# Patient Record
Sex: Male | Born: 1990 | Race: Black or African American | Hispanic: No | Marital: Married | Smoking: Never smoker
Health system: Southern US, Community
[De-identification: ages and names within clinical notes are randomized; demographics above are authoritative.]

---
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
## 2015-08-02 ENCOUNTER — Encounter (HOSPITAL_COMMUNITY): Payer: Self-pay

## 2015-08-02 ENCOUNTER — Emergency Department (HOSPITAL_COMMUNITY)
Admission: EM | Admit: 2015-08-02 | Discharge: 2015-08-02 | Disposition: A | Discharge status: Home / Self Care | Payer: Self-pay | Attending: Emergency Medicine | Admitting: Emergency Medicine

## 2015-08-02 ENCOUNTER — Emergency Department (HOSPITAL_COMMUNITY): Payer: Self-pay

## 2015-08-02 DIAGNOSIS — S99911A Unspecified injury of right ankle, initial encounter: Secondary | ICD-10-CM | POA: Insufficient documentation

## 2015-08-02 DIAGNOSIS — Y999 Unspecified external cause status: Secondary | ICD-10-CM | POA: Insufficient documentation

## 2015-08-02 DIAGNOSIS — Y9339 Activity, other involving climbing, rappelling and jumping off: Secondary | ICD-10-CM | POA: Insufficient documentation

## 2015-08-02 DIAGNOSIS — Y929 Unspecified place or not applicable: Secondary | ICD-10-CM | POA: Insufficient documentation

## 2015-08-02 DIAGNOSIS — X509XXA Other and unspecified overexertion or strenuous movements or postures, initial encounter: Secondary | ICD-10-CM | POA: Insufficient documentation

## 2015-08-02 MED ORDER — NAPROXEN 500 MG PO TABS: 500.0000 mg | ORAL_TABLET | Freq: Two times a day (BID) | ORAL | Status: AC

## 2015-08-02 NOTE — Progress Notes (Signed)
Orthopedic Tech Progress Note Patient Details:  Benjamin Bridges February 21, 1991 [id number]  Ortho Devices Type of Ortho Device: ASO, Crutches Ortho Device/Splint Location: rle Ortho Device/Splint Interventions: Application   Moxie Kalil 08/02/2015, 11:15 AM

## 2015-08-02 NOTE — ED Provider Notes (Signed)
CSN: [id number]     Arrival date & time 08/02/15  0932 History  By signing my name below, I, Renetta Chalk, attest that this documentation has been prepared under the direction and in the presence of Rian Busche PA-C.  Electronically Signed: Renetta Chalk, ED Scribe. 07/30/2015. 4:01 PM.  Chief Complaint  Patient presents with  . Ankle Injury   The history is provided by the patient. No language interpreter was used.   HPI Comments: Benjamin Bridges is a 25 y.o. male who presents to the Emergency Department complaining of constant, gradually worsening, right ankle pain that started last night. Pt states he was playing basketball when he jumped and twisted his ankle, causing it to invert. Pt rates his pain as 7/10. Pt reports pain is aggravated while bearing weight. He has applied ice with no relief. Pt denies taking any OTC medication for pain. He denies numbness or weakness, head trauma, neck/back pain, or any other complaints or injuries.  History reviewed. No pertinent past medical history. History reviewed. No pertinent past surgical history. No family history on file. Social History  Substance Use Topics  . Smoking status: Never Smoker   . Smokeless tobacco: None  . Alcohol Use: None    Review of Systems  Musculoskeletal: Positive for arthralgias (Right ankle).  Neurological: Negative for weakness and numbness.    Allergies  Review of patient's allergies indicates no known allergies.  Home Medications   Prior to Admission medications   Medication Sig Start Date End Date Taking? Authorizing Provider  naproxen (NAPROSYN) 500 MG tablet Take 1 tablet (500 mg total) by mouth 2 (two) times daily. 08/02/15   Lemya Greenwell C Ilamae Geng, PA-C   BP 124/62 mmHg  Pulse 53  Temp(Src) 98.6 F (37 C) (Oral)  Resp 18  SpO2 99% Physical Exam  Constitutional: He appears well-developed and well-nourished. No distress.  HENT:  Head: Normocephalic and atraumatic.  Eyes: Conjunctivae are normal.  Neck: Normal  range of motion. Neck supple.  Cardiovascular: Normal rate, regular rhythm and intact distal pulses.   Pulmonary/Chest: Effort normal.  Musculoskeletal: Normal range of motion. He exhibits tenderness.  Minor swelling and tenderness to medial right foot and ankle, distal pulses intact, full range of motion.  Neurological: He is alert.  No sensory deficits, strength 5/5.  Skin: Skin is warm and dry. He is not diaphoretic.  Psychiatric: He has a normal mood and affect. Judgment normal.  Nursing note and vitals reviewed.   ED Course  Procedures  DIAGNOSTIC STUDIES: Oxygen Saturation is 99% on RA, normal by my interpretation.  COORDINATION OF CARE: 10:10 AM Will order imaging of right ankle and advised to rest, ice, and splint the affected area. Discussed treatment plan with pt at bedside and pt agreed to plan.   Imaging Review Dg Ankle Complete Right  08/02/2015  CLINICAL DATA:  Pain following twisting injury while playing basketball 1 day prior EXAM: RIGHT ANKLE - COMPLETE 3+ VIEW COMPARISON:  None. FINDINGS: Frontal, oblique, and lateral views obtained. There is no fracture or joint effusion. The ankle mortise appears intact. There is a small spur along the proximal dorsal navicular. No appreciable joint space narrowing. IMPRESSION: Small spur along the dorsal proximal navicular. No demonstrable fracture. Ankle mortise appears intact. Electronically Signed   By: Bretta Bang III M.D.   On: 08/02/2015 10:09   I have personally reviewed and evaluated these images as part of my medical decision-making.   EKG Interpretation None      MDM  Final diagnoses:  Ankle injury, right, initial encounter   Benjamin Bridges presents with right ankle injury occurring last night.  No fracture. Suspect sprain. RICE protocol and orthopedic follow-up discussed. Patient voiced understanding of these instructions and is comfortable with discharge.  Filed Vitals:   08/02/15 0937 08/02/15 1045   BP: 124/62 126/60  Pulse: 53 56  Temp: 98.6 F (37 C) 98.8 F (37.1 C)  TempSrc: Oral Oral  Resp: 18 20  SpO2: 99% 100%     I personally performed the services described in this documentation, which was scribed in my presence. The recorded information has been reviewed and is accurate.   Anselm PancoastShawn C Asher Babilonia, PA-C 08/02/15 1737  Pricilla LovelessScott Goldston, MD 08/03/15 [phone]

## 2015-08-02 NOTE — Discharge Instructions (Signed)
You have been seen today for an ankle injury. Your imaging showed no abnormalities. Follow-up with orthopedics should symptoms continue. Rest, ice, compression, and elevation, as well as anti-inflammatory medications such as naproxen or ibuprofen, are the indicated treatments for this issue. Take 500 mg of naproxen every 12 hours or 800 mg of ibuprofen every 8 hours for the next 3 days. Take these medications with food to avoid an upset stomach. Follow up with PCP as needed. Return to ED should symptoms worsen.

## 2015-08-02 NOTE — ED Notes (Signed)
Ortho called to come place ASO and give crutches.

## 2015-08-02 NOTE — ED Notes (Signed)
Patient here with right ankle pain after twisting same while playing basketball last night

## 2023-05-17 ENCOUNTER — Emergency Department (HOSPITAL_COMMUNITY)
Admission: EM | Admit: 2023-05-17 | Discharge: 2023-05-17 | Disposition: A | Discharge status: Home / Self Care | Payer: Self-pay | Attending: Emergency Medicine | Admitting: Emergency Medicine

## 2023-05-17 ENCOUNTER — Emergency Department (HOSPITAL_COMMUNITY): Payer: Self-pay

## 2023-05-17 DIAGNOSIS — R630 Anorexia: Secondary | ICD-10-CM | POA: Insufficient documentation

## 2023-05-17 DIAGNOSIS — R1114 Bilious vomiting: Secondary | ICD-10-CM | POA: Insufficient documentation

## 2023-05-17 LAB — CBC WITH DIFFERENTIAL/PLATELET
Abs Immature Granulocytes: 0.05 10*3/uL (ref 0.00–0.07)
Basophils Absolute: 0.1 10*3/uL (ref 0.0–0.1)
Basophils Relative: 0 %
Eosinophils Absolute: 0 10*3/uL (ref 0.0–0.5)
Eosinophils Relative: 0 %
HCT: 49.7 % (ref 39.0–52.0)
Hemoglobin: 16.9 g/dL (ref 13.0–17.0)
Immature Granulocytes: 0 %
Lymphocytes Relative: 7 %
Lymphs Abs: 0.9 10*3/uL (ref 0.7–4.0)
MCH: 30.4 pg (ref 26.0–34.0)
MCHC: 34 g/dL (ref 30.0–36.0)
MCV: 89.4 fL (ref 80.0–100.0)
Monocytes Absolute: 0.6 10*3/uL (ref 0.1–1.0)
Monocytes Relative: 4 %
Neutro Abs: 11.9 10*3/uL — ABNORMAL HIGH (ref 1.7–7.7)
Neutrophils Relative %: 89 %
Platelets: 282 10*3/uL (ref 150–400)
RBC: 5.56 MIL/uL (ref 4.22–5.81)
RDW: 13.4 % (ref 11.5–15.5)
WBC: 13.5 10*3/uL — ABNORMAL HIGH (ref 4.0–10.5)
nRBC: 0 % (ref 0.0–0.2)

## 2023-05-17 LAB — COMPREHENSIVE METABOLIC PANEL
ALT: 36 U/L (ref 0–44)
AST: 22 U/L (ref 15–41)
Albumin: 3.9 g/dL (ref 3.5–5.0)
Alkaline Phosphatase: 38 U/L (ref 38–126)
Anion gap: 6 (ref 5–15)
BUN: 11 mg/dL (ref 6–20)
CO2: 25 mmol/L (ref 22–32)
Calcium: 8.2 mg/dL — ABNORMAL LOW (ref 8.9–10.3)
Chloride: 109 mmol/L (ref 98–111)
Creatinine, Ser: 0.99 mg/dL (ref 0.61–1.24)
GFR, Estimated: >60 mL/min (ref >60)
Glucose, Bld: 92 mg/dL (ref 70–99)
Potassium: 3.3 mmol/L — ABNORMAL LOW (ref 3.5–5.1)
Sodium: 140 mmol/L (ref 135–145)
Total Bilirubin: 0.7 mg/dL (ref 0.0–1.2)
Total Protein: 6.5 g/dL (ref 6.5–8.1)

## 2023-05-17 LAB — MAGNESIUM: Magnesium: 1.6 mg/dL — ABNORMAL LOW (ref 1.7–2.4)

## 2023-05-17 LAB — ETHANOL: Alcohol, Ethyl (B): <10 mg/dL (ref <10)

## 2023-05-17 LAB — RESP PANEL BY RT-PCR (RSV, FLU A&B, COVID)  RVPGX2
Influenza A by PCR: NEGATIVE
Influenza B by PCR: NEGATIVE
Resp Syncytial Virus by PCR: NEGATIVE
SARS Coronavirus 2 by RT PCR: NEGATIVE

## 2023-05-17 MED ORDER — FAMOTIDINE 20 MG PO TABS: 20.0000 mg | ORAL_TABLET | Freq: Two times a day (BID) | ORAL | 0 refills | Status: AC

## 2023-05-17 MED ORDER — SUCRALFATE 1 GM/10ML PO SUSP
1.0000 g | Freq: Once | ORAL | Status: AC
  Administered 2023-05-17: 1 g via ORAL
  Filled 2023-05-17: qty 10

## 2023-05-17 MED ORDER — SUCRALFATE 1 G PO TABS: 1.0000 g | ORAL_TABLET | Freq: Three times a day (TID) | ORAL | 0 refills | Status: AC

## 2023-05-17 MED ORDER — SODIUM CHLORIDE 0.9 % IV BOLUS
1000.0000 mL | Freq: Once | INTRAVENOUS | Status: AC
  Administered 2023-05-17: 1000 mL via INTRAVENOUS

## 2023-05-17 MED ORDER — IOHEXOL 300 MG/ML  SOLN
100.0000 mL | Freq: Once | INTRAMUSCULAR | Status: AC | PRN
  Administered 2023-05-17: 100 mL via INTRAVENOUS

## 2023-05-17 MED ORDER — DROPERIDOL 2.5 MG/ML IJ SOLN: 1.2500 mg | Freq: Once | INTRAMUSCULAR | Status: DC

## 2023-05-17 MED ORDER — FAMOTIDINE IN NACL 20-0.9 MG/50ML-% IV SOLN
20.0000 mg | Freq: Once | INTRAVENOUS | Status: AC
  Administered 2023-05-17: 20 mg via INTRAVENOUS
  Filled 2023-05-17: qty 50

## 2023-05-17 MED ORDER — ONDANSETRON HCL 4 MG/2ML IJ SOLN
4.0000 mg | Freq: Once | INTRAMUSCULAR | Status: AC
  Administered 2023-05-17: 4 mg via INTRAVENOUS
  Filled 2023-05-17: qty 2
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
## 2023-05-17 NOTE — ED Provider Notes (Signed)
 [place] EMERGENCY DEPARTMENT AT [facility] Provider Note   CSN: [id number] Arrival date & time: 05/17/23  1212     History  Chief Complaint  Patient presents with   Emesis    [name] is a 33 y.o. male.  HPI Presents 1 day after his birthday now with abdominal pain, nausea, vomiting. He notes that he was having some pain in his abdomen before celebrating, drinking.  He has continued have pain, worse since that time with associated nausea, vomiting, anorexia. No other pain, no relief with anything, no ability to tolerate p.o. for anything.  Patient denies history of abdominal surgery.    Home Medications Prior to Admission medications   Medication Sig Start Date End Date Taking? Authorizing Provider  famotidine (PEPCID) 20 MG tablet Take 1 tablet (20 mg total) by mouth 2 (two) times daily. 05/17/23  Yes Gerhard Munch, MD  sucralfate (CARAFATE) 1 g tablet Take 1 tablet (1 g total) by mouth 4 (four) times daily -  with meals and at bedtime. 05/17/23  Yes Gerhard Munch, MD  naproxen (NAPROSYN) 500 MG tablet Take 1 tablet (500 mg total) by mouth 2 (two) times daily. 08/02/15   Joy, Shawn C, PA-C      Allergies    Patient has no known allergies.    Review of Systems   Review of Systems  Physical Exam Updated Vital Signs BP 120/63 (BP Location: Right Arm)   Pulse 97   Temp 99 F (37.2 C) (Oral)   Resp 14   Ht 5\' 7"  (1.702 m)   Wt 99.8 kg   SpO2 100%   BMI 34.46 kg/m  Physical Exam Vitals and nursing note reviewed.  Constitutional:      General: He is not in acute distress.    Appearance: He is well-developed.     Comments: Uncomfortable appearing young adult male  HENT:     Head: Normocephalic and atraumatic.  Eyes:     Conjunctiva/sclera: Conjunctivae normal.  Cardiovascular:     Rate and Rhythm: Normal rate and regular rhythm.  Pulmonary:     Effort: Pulmonary effort is normal. No respiratory distress.     Breath sounds: No stridor.   Abdominal:     General: There is no distension.     Tenderness: There is abdominal tenderness. There is guarding.  Skin:    General: Skin is warm and dry.  Neurological:     Mental Status: He is alert and oriented to person, place, and time.     ED Results / Procedures / Treatments   Labs (all labs ordered are listed, but only abnormal results are displayed) Labs Reviewed  COMPREHENSIVE METABOLIC PANEL - Abnormal; Notable for the following components:      Result Value   Potassium 3.3 (*)    Calcium 8.2 (*)    All other components within normal limits  CBC WITH DIFFERENTIAL/PLATELET - Abnormal; Notable for the following components:   WBC 13.5 (*)    Neutro Abs 11.9 (*)    All other components within normal limits  MAGNESIUM - Abnormal; Notable for the following components:   Magnesium 1.6 (*)    All other components within normal limits  RESP PANEL BY RT-PCR (RSV, FLU A&B, COVID)  [id number]  ETHANOL    EKG None  Radiology CT ABDOMEN PELVIS W CONTRAST Result Date: 05/17/2023 CLINICAL DATA:  Abdominal pain, acute, nonlocalized. EXAM: CT ABDOMEN AND PELVIS WITH CONTRAST TECHNIQUE: Multidetector CT imaging of the abdomen and  pelvis was performed using the standard protocol following bolus administration of intravenous contrast. RADIATION DOSE REDUCTION: This exam was performed according to the departmental dose-optimization program which includes automated exposure control, adjustment of the mA and/or kV according to patient size and/or use of iterative reconstruction technique. CONTRAST:  OMNIPAQUE IOHEXOL 300 MG/ML  SOLN COMPARISON:  01/19/2021 FINDINGS: Lower chest: Lung bases are clear. Hepatobiliary: Liver parenchyma is normal.  No calcified gallstones. Pancreas: Normal Spleen: Normal Adrenals/Urinary Tract: Adrenal glands are normal. Kidneys are normal. No mass, stone or hydronephrosis. Bladder is normal. Stomach/Bowel: Stomach and small intestine are normal. Normal  appendix. Normal colon. Vascular/Lymphatic: Aorta and IVC are normal.  No adenopathy. Reproductive: Normal Other: No free fluid or air.  No hernia. Musculoskeletal: Normal IMPRESSION: Normal CT of the abdomen and pelvis. No abnormality seen to explain the clinical presentation. Electronically Signed   By: Paulina Fusi M.D.   On: 05/17/2023 15:29    Procedures Procedures    Medications Ordered in ED Medications  sodium chloride 0.9 % bolus 1,000 mL (1,000 mLs Intravenous New Bag/Given 05/17/23 1406)  ondansetron (ZOFRAN) injection 4 mg (4 mg Intravenous Given 05/17/23 1406)  famotidine (PEPCID) IVPB 20 mg premix (0 mg Intravenous Stopped 05/17/23 1445)  sucralfate (CARAFATE) 1 GM/10ML suspension 1 g (1 g Oral Given 05/17/23 1455)  iohexol (OMNIPAQUE) 300 MG/ML solution 100 mL (100 mLs Intravenous Contrast Given 05/17/23 1459)    ED Course/ Medical Decision Making/ A&P                                 Medical Decision Making Today's well 33 year old male presents with abdominal pain, nausea, vomiting. Broad differential including intra-abdominal infection such as appendicitis versus obstruction, though this is less likely, versus acute alcohol intoxication, but the patient's description of pain that began prior to drinking alcohol suggests this is less likely. Patient received meds, fluids, was monitored, CT, labs ordered. Cardiac 55 sinus normal Pulse ox 100% room air normal   Amount and/or Complexity of Data Reviewed External Data Reviewed: notes. Labs: ordered. Decision-making details documented in ED Course. Radiology: ordered and independent interpretation performed. Decision-making details documented in ED Course.  Risk Prescription drug management. Decision regarding hospitalization.   5:30 PM  Patient awake, alert, tolerating p.o. CT reviewed, unremarkable, labs reviewed, unremarkable aside from mild leukocytosis.  Given his description of alcohol intake, some suspicion for  gastritis, without evidence for obstruction, infection, bacteremia, sepsis, or substantial electrolyte abnormalities, after fluid resuscitation, improvement here patient discharged in stable condition.        Final Clinical Impression(s) / ED Diagnoses Final diagnoses:  Bilious vomiting with nausea    Rx / DC Orders ED Discharge Orders          Ordered    famotidine (PEPCID) 20 MG tablet  2 times daily        05/17/23 1728    sucralfate (CARAFATE) 1 g tablet  3 times daily with meals & bedtime       Note to Pharmacy: Take for one week   05/17/23 1728              Gerhard Munch, MD 05/17/23 1730
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
